# Patient Record
Sex: Female | Born: 2000
Health system: Southern US, Community
[De-identification: ages and names within clinical notes are randomized; demographics above are authoritative.]

## PROBLEM LIST (undated history)

## (undated) DIAGNOSIS — F419 Anxiety disorder, unspecified: Secondary | ICD-10-CM

## (undated) DIAGNOSIS — F32A Depression, unspecified: Secondary | ICD-10-CM

---
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
## 2022-07-09 IMAGING — US US MFM OB COMPLETE +14 WKS
2 series · 15 of 28 positions shown · non-contrast
Comparison: none

[Series 1: us mfm ob complete +14 wks · 47 acquisitions, 13 frames shown (1 of 2)]
[im 1/47]
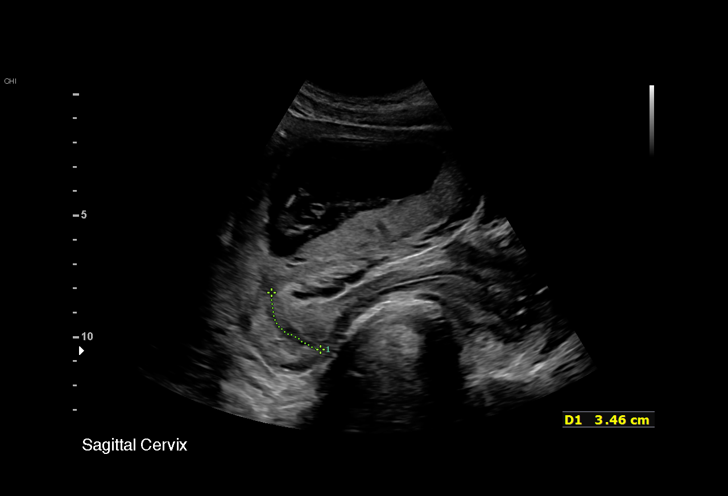
[im 4/47]
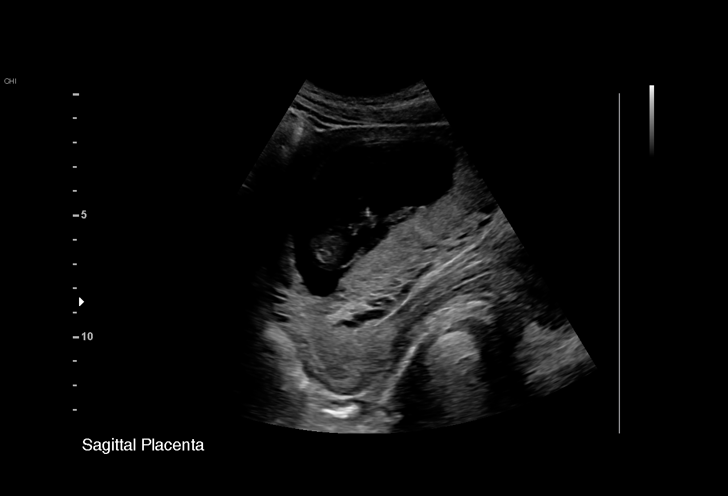
[im 8/47]
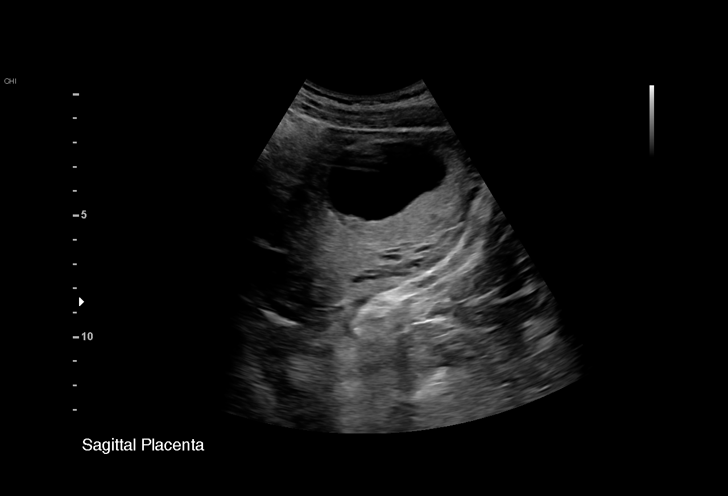
[im 12/47]
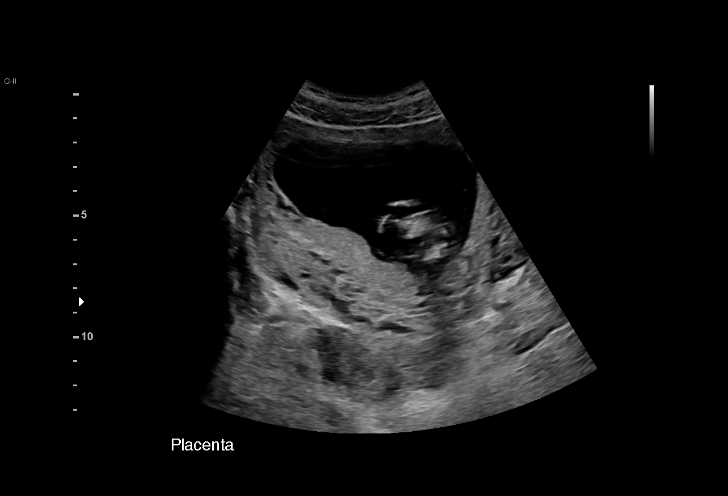
[im 16/47]
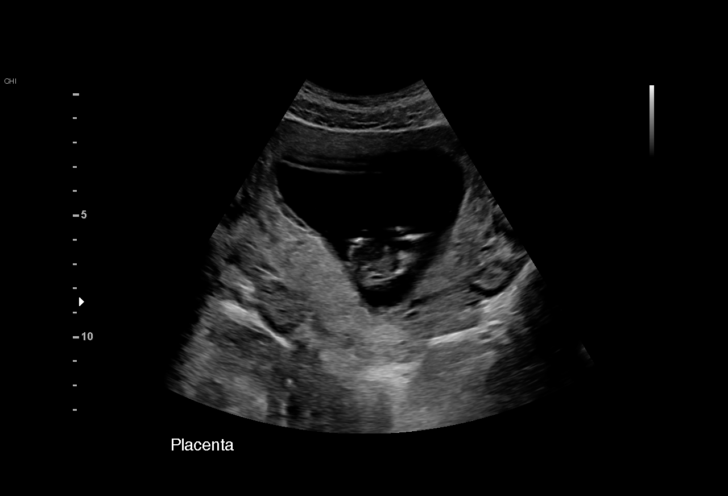
[im 20/47]
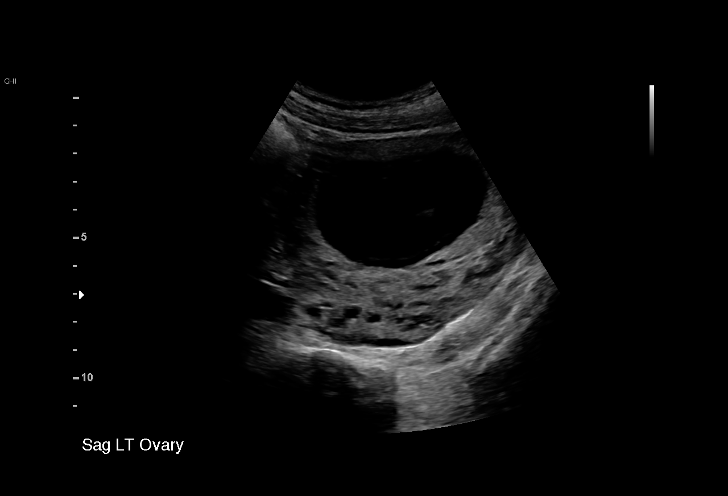
[im 24/47]
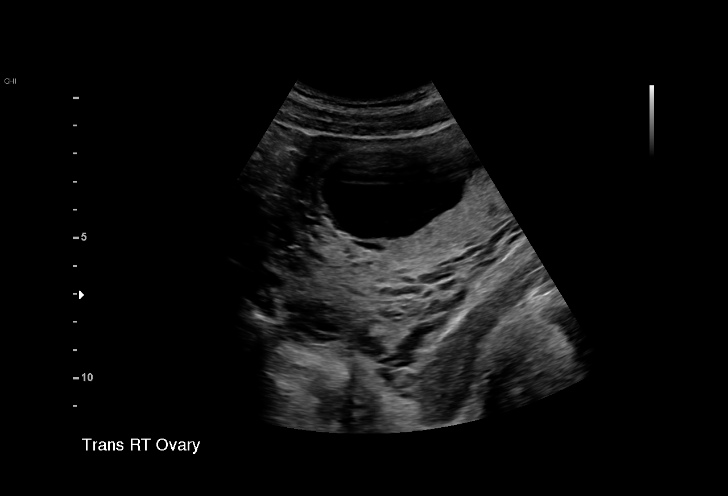
[im 27/47]
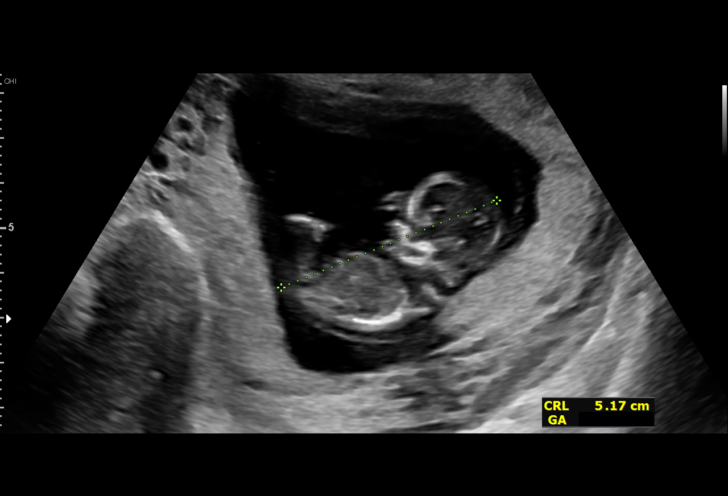
[im 29/47]
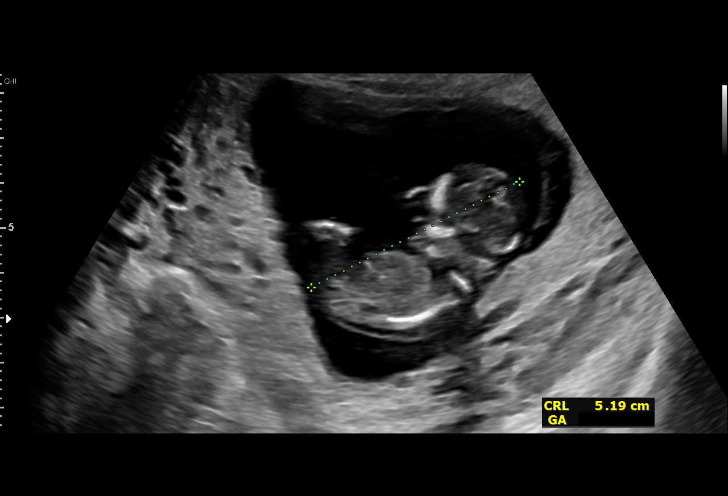
[im 33/47]
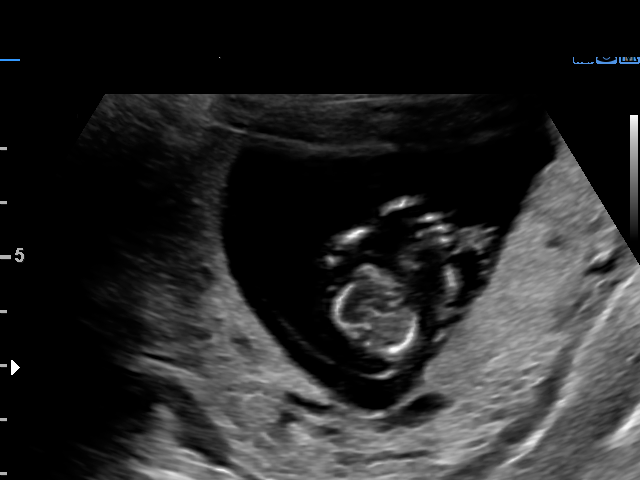
[im 37/47]
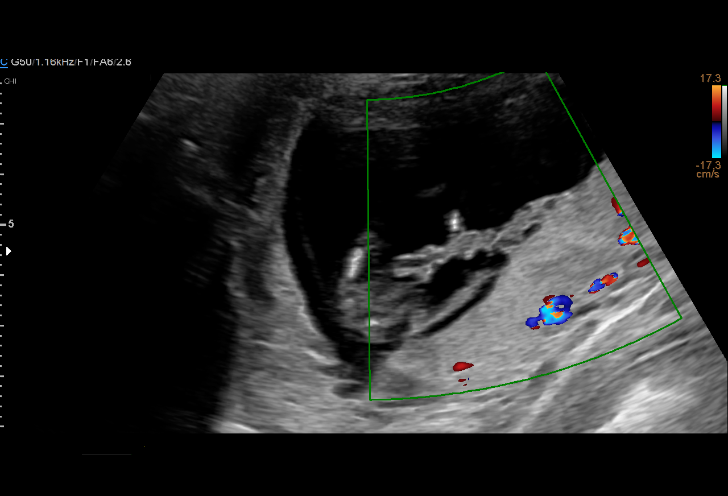
[im 41/47]
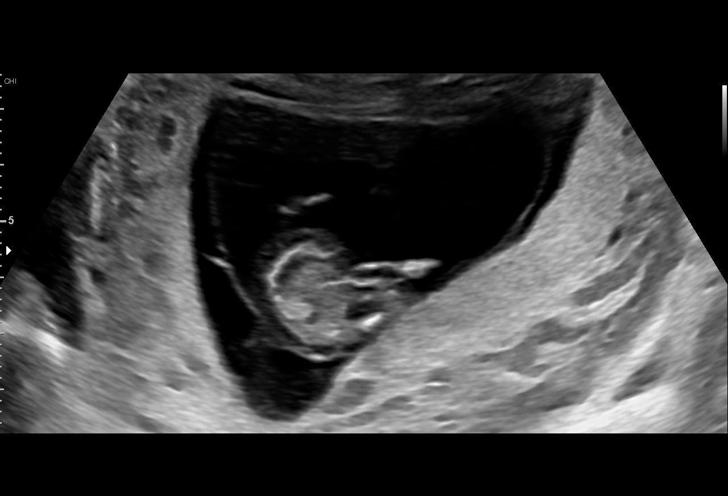
[im 45/47]
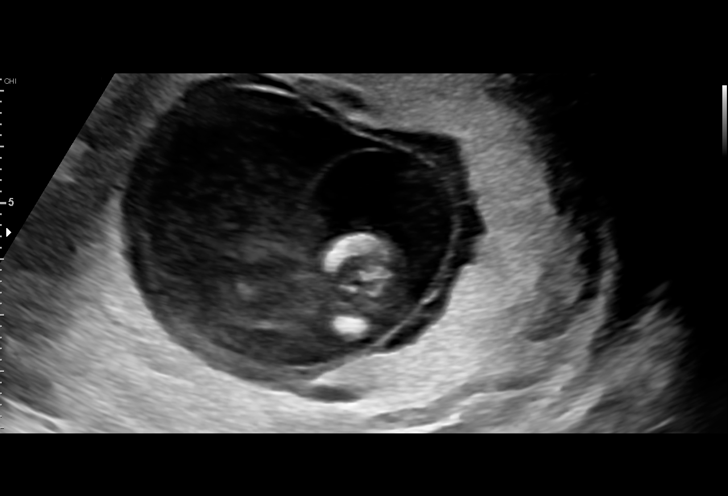

[Series 3: us mfm ob complete +14 wks · 2 of 5 slices shown (2 of 2)]
[im 1/5]
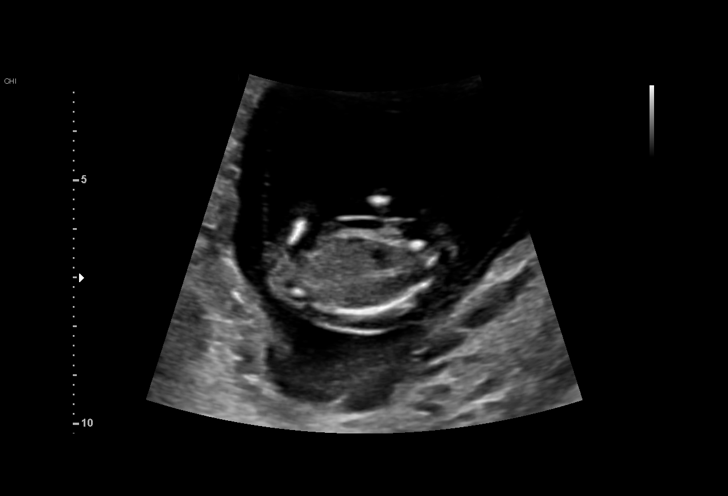
[im 5/5]
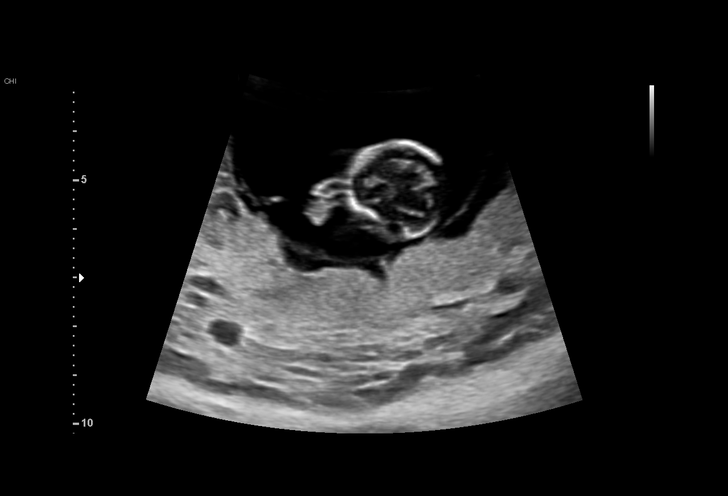

[15 of 28 positions shown; findings below may reference images not displayed]

[REDACTED]
                                                            Healthcare

    14 WEEKS

Indications

 11 weeks gestation of pregnancy
 Maternal Care of intrauterine death, not
 applicable or unspecified
 Teen pregnancy
Fetal Evaluation

 Num Of Fetuses:         1
 Cardiac Activity:       Not visualized
 Placenta:               Anterior
 P. Cord Insertion:      Visualized, central

 Amniotic Fluid
 AFI FV:      Within normal limits
Biometry

 CRL:      51.6  mm     G. Age:  11w 5d                  EDD:   09/08/21
OB History

 Gravidity:    1
Gestational Age
 LMP:           20w 2d        Date:  10/03/20                 EDD:   07/10/21
 Best:          11w 5d     Det. By:  U/S C R L (02/22/21)     EDD:   09/08/21
Cervix Uterus Adnexa

 Cervix
 Normal appearance by transabdominal scan.

 Uterus
 No abnormality visualized.

 Right Ovary
 Within normal limits.

 Left Ovary
 Within normal limits.

 Cul De Sac
 No free fluid seen.

 Adnexa
 No abnormality visualized.

 Comment
 Anteverted uterus.
Impression

 G1 P0. Patient was seen at your office yesterday and fetal
 demise was suspected. She does not give history of vaginal
 bleeding. She is unsure of her LMP date.
 On ultrasound, the CRL measurement is consistent with 11w
 5d gestation. Unfortunately, fetal heart activity was absent.
 Body wall edema was seen. No evidence of anencephaly.
 Limbs were visualized. No omphalocele was present. Amnion
 was separated from the chorion.
 I explained the findings and that CRL measurement is not a
 true estimate of the gestational age.
 Briefly discussed the causes including possible chromosomal
 anomalies. I advised her to continue prenatal vitamins (folic
 acid) till she chooses an effective contraception. Folic acid
 reduces the likelihood of open-neural tube defects, and
 possibly, other anomalies,
 Patient has an appointment with her Ob provider after
 ultrasound.
Recommendations

 -Chromosomal analysis of products of conception.
                 MINISTER
